# Patient Record
Sex: Male | Born: 1975 | Race: Black or African American | Hispanic: No | Marital: Married | Smoking: Former smoker
Health system: Southern US, Community
[De-identification: ages and names within clinical notes are randomized; demographics above are authoritative.]

## PROBLEM LIST (undated history)

## (undated) DIAGNOSIS — G4730 Sleep apnea, unspecified: Secondary | ICD-10-CM

## (undated) DIAGNOSIS — I10 Essential (primary) hypertension: Secondary | ICD-10-CM

## (undated) DIAGNOSIS — K219 Gastro-esophageal reflux disease without esophagitis: Secondary | ICD-10-CM

## (undated) HISTORY — DX: Sleep apnea, unspecified: G47.30

## (undated) HISTORY — PX: HERNIA REPAIR: SHX51

## (undated) HISTORY — DX: Gastro-esophageal reflux disease without esophagitis: K21.9

## (undated) HISTORY — DX: Essential (primary) hypertension: I10

## (undated) HISTORY — PX: LAPAROSCOPIC GASTRIC SLEEVE RESECTION: SHX5895

---

## 2013-10-23 ENCOUNTER — Ambulatory Visit: Payer: Self-pay | Admitting: Podiatry

## 2013-11-06 ENCOUNTER — Ambulatory Visit (INDEPENDENT_AMBULATORY_CARE_PROVIDER_SITE_OTHER): Payer: Commercial Managed Care - PPO | Admitting: Podiatry

## 2013-11-06 ENCOUNTER — Ambulatory Visit (INDEPENDENT_AMBULATORY_CARE_PROVIDER_SITE_OTHER): Payer: Commercial Managed Care - PPO

## 2013-11-06 ENCOUNTER — Encounter: Payer: Self-pay | Admitting: Podiatry

## 2013-11-06 VITALS — BP 140/96 | HR 88 | Resp 16 | Ht 70.0 in | Wt 286.0 lb

## 2013-11-06 DIAGNOSIS — L259 Unspecified contact dermatitis, unspecified cause: Secondary | ICD-10-CM

## 2013-11-06 DIAGNOSIS — R52 Pain, unspecified: Secondary | ICD-10-CM

## 2013-11-06 DIAGNOSIS — M7750 Other enthesopathy of unspecified foot: Secondary | ICD-10-CM

## 2013-11-06 DIAGNOSIS — B351 Tinea unguium: Secondary | ICD-10-CM

## 2013-11-06 MED ORDER — TERBINAFINE HCL 250 MG PO TABS: 250.0000 mg | ORAL_TABLET | Freq: Every day | ORAL | Status: DC

## 2013-11-06 NOTE — Progress Notes (Signed)
Subjective:     Patient ID: Jerry Esparza, male   DOB: Nov 01, 1976, 37 y.o.   MRN: [id number]  Foot Pain   patient states both of my feet are sore at the end of the day I have a lot of problems with my toenails and I have dry skin H. that he has had his nails removed and a regrew thick and discomfort   Review of Systems  All other systems reviewed and are negative.       Objective:   Physical Exam  Constitutional: He is oriented to person, place, and time. He appears well-developed.  Cardiovascular: Intact distal pulses.   Musculoskeletal: Normal range of motion.  Neurological: He is oriented to person, place, and time.  Skin: Skin is warm.   patient's muscle strength was adequate with mild equinus condition noted both feet I did noted depression of the arch bilateral with normal subtalar and mid tarsal joint range of motion. Moderate discomfort in the arch noted both feet. Nailbeds are very thickened and discomforting when pressed both feet. Dry skin noted both feet    Assessment:     Plantar fasciitis bilateral secondary to foot structure. 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 Night splint dispensed with instructions on usage for stretch of the arch and patient scanned for custom orthotic devices. Lamisil was prescribed with instructions on usage and patient will have liver function study done at the lab today. Begin Vaseline with saran wrap to try to help dry skin

## 2013-11-06 NOTE — Progress Notes (Signed)
N aching  L both feet arch to heel  D over 1 year  O after doing a 5k  C about the same A in the morning and late night  T ibuprofen , epsom salt soaks

## 2013-11-06 NOTE — Patient Instructions (Signed)
vaseline before bed twice a week and then wrap in saran wrap and white sock

## 2013-11-09 ENCOUNTER — Encounter: Payer: Self-pay | Admitting: Podiatry

## 2013-12-04 ENCOUNTER — Ambulatory Visit (INDEPENDENT_AMBULATORY_CARE_PROVIDER_SITE_OTHER): Payer: Commercial Managed Care - PPO | Admitting: Podiatry

## 2013-12-04 ENCOUNTER — Encounter: Payer: Self-pay | Admitting: Podiatry

## 2013-12-04 VITALS — BP 152/74 | HR 85 | Resp 16 | Ht 70.0 in | Wt 280.0 lb

## 2013-12-04 DIAGNOSIS — M7750 Other enthesopathy of unspecified foot: Secondary | ICD-10-CM

## 2013-12-04 NOTE — Patient Instructions (Signed)

## 2013-12-05 NOTE — Progress Notes (Signed)
Subjective:     Patient ID: Jerry Esparza, male   DOB: 08/17/1976, 37 y.o.   MRN: [id number]  HPI feet feeling better and orthotics fitted well   Review of Systems     Objective:   Physical Exam Neurovascular status intact with chronic tendinitis that improving    Assessment:     Tendinitis that is getting better    Plan:     Dispensed orthotics with instructions of usage physical therapy and shoe gear usage reappoint her recheck 6 weeks

## 2014-01-15 ENCOUNTER — Ambulatory Visit: Payer: Commercial Managed Care - PPO | Admitting: Podiatry

## 2014-12-04 ENCOUNTER — Inpatient Hospital Stay: Payer: Self-pay | Admitting: Internal Medicine

## 2014-12-04 LAB — COMPREHENSIVE METABOLIC PANEL
ALBUMIN: 3.5 g/dL (ref 3.4–5.0)
ALT: 36 U/L
ANION GAP: 8 (ref 7–16)
AST: 25 U/L (ref 15–37)
Alkaline Phosphatase: 80 U/L
BUN: 9 mg/dL (ref 7–18)
Bilirubin,Total: 0.9 mg/dL (ref 0.2–1.0)
CHLORIDE: 104 mmol/L (ref 98–107)
CREATININE: 1.1 mg/dL (ref 0.60–1.30)
Calcium, Total: 8.8 mg/dL (ref 8.5–10.1)
Co2: 28 mmol/L (ref 21–32)
EGFR (African American): >60
EGFR (Non-African Amer.): >60
GLUCOSE: 81 mg/dL (ref 65–99)
Osmolality: 277 (ref 275–301)
POTASSIUM: 3.9 mmol/L (ref 3.5–5.1)
SODIUM: 140 mmol/L (ref 136–145)
TOTAL PROTEIN: 8.3 g/dL — AB (ref 6.4–8.2)

## 2014-12-04 LAB — CBC
HCT: 48 % (ref 40.0–52.0)
HGB: 15.1 g/dL (ref 13.0–18.0)
MCH: 29.3 pg (ref 26.0–34.0)
MCHC: 31.5 g/dL — ABNORMAL LOW (ref 32.0–36.0)
MCV: 93 fL (ref 80–100)
PLATELETS: 375 10*3/uL (ref 150–440)
RBC: 5.17 10*6/uL (ref 4.40–5.90)
RDW: 14.1 % (ref 11.5–14.5)
WBC: 16 10*3/uL — AB (ref 3.8–10.6)

## 2014-12-04 LAB — URIC ACID: Uric Acid: 4.9 mg/dL (ref 3.5–7.2)

## 2014-12-05 LAB — COMPREHENSIVE METABOLIC PANEL
ALT: 29 U/L
AST: 14 U/L — AB (ref 15–37)
Albumin: 3 g/dL — ABNORMAL LOW (ref 3.4–5.0)
Alkaline Phosphatase: 76 U/L
Anion Gap: 8 (ref 7–16)
BILIRUBIN TOTAL: 0.5 mg/dL (ref 0.2–1.0)
BUN: 16 mg/dL (ref 7–18)
CHLORIDE: 110 mmol/L — AB (ref 98–107)
CREATININE: 1.05 mg/dL (ref 0.60–1.30)
Calcium, Total: 8.9 mg/dL (ref 8.5–10.1)
Co2: 23 mmol/L (ref 21–32)
EGFR (African American): >60
EGFR (Non-African Amer.): >60
Glucose: 111 mg/dL — ABNORMAL HIGH (ref 65–99)
OSMOLALITY: 283 (ref 275–301)
Potassium: 4 mmol/L (ref 3.5–5.1)
Sodium: 141 mmol/L (ref 136–145)
TOTAL PROTEIN: 8 g/dL (ref 6.4–8.2)

## 2014-12-05 LAB — URINALYSIS, COMPLETE
BLOOD: NEGATIVE
Bilirubin,UR: NEGATIVE
Glucose,UR: NEGATIVE mg/dL (ref 0–75)
KETONE: NEGATIVE
Leukocyte Esterase: NEGATIVE
Nitrite: NEGATIVE
PH: 5 (ref 4.5–8.0)
Protein: 30
RBC,UR: 3 /HPF (ref 0–5)
Specific Gravity: 1.038 (ref 1.003–1.030)
WBC UR: 3 /HPF (ref 0–5)

## 2014-12-05 LAB — CBC WITH DIFFERENTIAL/PLATELET
BASOS PCT: 0.1 %
Basophil #: 0 10*3/uL (ref 0.0–0.1)
Eosinophil #: 0 10*3/uL (ref 0.0–0.7)
Eosinophil %: 0 %
HCT: 45.6 % (ref 40.0–52.0)
HGB: 14.6 g/dL (ref 13.0–18.0)
Lymphocyte #: 0.8 10*3/uL — ABNORMAL LOW (ref 1.0–3.6)
Lymphocyte %: 4.4 %
MCH: 30 pg (ref 26.0–34.0)
MCHC: 32.1 g/dL (ref 32.0–36.0)
MCV: 93 fL (ref 80–100)
Monocyte #: 0.7 x10 3/mm (ref 0.2–1.0)
Monocyte %: 3.9 %
Neutrophil #: 16.5 10*3/uL — ABNORMAL HIGH (ref 1.4–6.5)
Neutrophil %: 91.6 %
Platelet: 377 10*3/uL (ref 150–440)
RBC: 4.89 10*6/uL (ref 4.40–5.90)
RDW: 14.4 % (ref 11.5–14.5)
WBC: 18 10*3/uL — AB (ref 3.8–10.6)

## 2015-03-14 DIAGNOSIS — N529 Male erectile dysfunction, unspecified: Secondary | ICD-10-CM | POA: Insufficient documentation

## 2015-03-14 DIAGNOSIS — K219 Gastro-esophageal reflux disease without esophagitis: Secondary | ICD-10-CM | POA: Insufficient documentation

## 2015-03-14 DIAGNOSIS — I10 Essential (primary) hypertension: Secondary | ICD-10-CM | POA: Insufficient documentation

## 2015-03-16 DIAGNOSIS — Z6841 Body Mass Index (BMI) 40.0 and over, adult: Secondary | ICD-10-CM | POA: Insufficient documentation

## 2015-04-17 DIAGNOSIS — Z9989 Dependence on other enabling machines and devices: Secondary | ICD-10-CM

## 2015-04-17 DIAGNOSIS — G4733 Obstructive sleep apnea (adult) (pediatric): Secondary | ICD-10-CM | POA: Insufficient documentation

## 2015-04-19 NOTE — Op Note (Signed)
PATIENT NAME:  Novella RobDOVE, Amritpal MR#:  [id number] DATE OF BIRTH:  03-03-1976  DATE OF PROCEDURE:  12/04/2014  PREOPERATIVE DIAGNOSIS:   Right tonsillitis with possible peritonsillar abscess.   POSTOPERATIVE DIAGNOSIS: Right tonsillitis with possible peritonsillar abscess.   PROCEDURE: Aspiration of right peritonsillar region.   SURGEON: Marion DownerScott Dresden Lozito, M.D.   ANESTHESIA: 1% lidocaine with epinephrine 1:200,000.   DESCRIPTION OF PROCEDURE: After discussing the procedure with the patient, the region around the right tonsil was injected with 1% lidocaine with epinephrine 1:200,000. An 18-gauge needle was then used to make several passes along the superior pole of the tonsil and just lateral to the tonsil. No purulence was encountered during attempts at aspiration, just some bloody material. This most likely represents peritonsillar cellulitis with no frank abscess. The patient tolerated the procedure well with minimal bleeding.     ____________________________ Ollen GrossPaul S. Willeen CassBennett, MD psb:kl D: 12/04/2014 12:32:51 ET T: 12/04/2014 16:53:35 ET JOB#: [id number]  cc: Ollen GrossPaul S. Willeen CassBennett, MD, <Dictator> Sandi MealyPAUL S Lailoni Baquera MD ELECTRONICALLY SIGNED 12/15/2014 13:57

## 2015-04-19 NOTE — Consult Note (Signed)
PATIENT NAME:  Jerry Esparza, Jerry Esparza MR#:  [id number] DATE OF BIRTH:  March 03, 1976  DATE OF CONSULTATION:  12/04/2014  REFERRING PHYSICIAN:  Dr. Mayford KnifeWilliams CONSULTING PHYSICIAN:  Ollen GrossPaul S. Willeen CassBennett, MD  REASON FOR CONSULTATION: Tonsillitis with possible abscess.   HISTORY OF PRESENT ILLNESS: A 39 year old male presents with a 3-day history of sore throat and difficulty swallowing, worsening today. He did start some antibiotics yesterday. There is no history of recurrent tonsillitis. He actually recalls having some type of surgery as a child, possibly an adenoidectomy. Initially there was a thought he could have angioedema; however, he did have an elevated white count and CT scan suggested severe right-sided tonsillitis with some hypodensity around the tonsil. They could not completely exclude the possibility of an abscess so I was consulted to evaluate further.   PAST MEDICAL HISTORY: Hypertension No history of diabetes or asthma.   MEDICATION: Lisinopril 20 mg p.o. daily.   ALLERGIES: None   SOCIAL HISTORY: The patient is a nonsmoker.   REVIEW OF SYSTEMS: The patient has had a sore throat and some low-grade fever with muffling of the voice. He is not having any significant breathing difficulty, although his wife reports that occasionally he feels short of breath intermittently. The preceded this actual illness. No chest pain, rash, nausea, vomiting, diarrhea.   PHYSICAL EXAMINATION: VITAL SIGNS: Temperature 98.1, pulse 99, oxygen concentration 99% on room air.  GENERAL: Well-developed, well-nourished male, obviously uncomfortable, but not in any respiratory distress.  HEAD AND FACE: Head is normocephalic, atraumatic. No facial skin lesions. Facial strength is normal and symmetric.  EARS: External ears are unremarkable. Ear canals are free of cerumen. Tympanic membranes are clear bilaterally. NOSE: External nose unremarkable. Nasal cavity is clear. No purulence or polyps are seen. Septum is straight.  ORAL  CAVITY AND OROPHARYNX: Teeth, lips and gums unremarkable. Tongue and floor of mouth without lesions. Posterior pharynx reveals edema of the right palate and tonsil with scant exudate. The left tonsil was normal in size without exudate. The uvula is not significantly deviated, but there is edema of the uvula.  NECK: Supple with tender jugulodigastric adenopathy on the right without fluctuance. There is no thyromegaly. Salivary glands are soft and without masses.   DATA REVIEW: The patient's white count is elevated at 16.1. His CT scan was reviewed personally and agree he has significant tonsillar swelling on the right side with cervical lymphadenopathy and some hypodensity adjacent to the tonsil and laterally. There was no frank abscess on CT.   ASSESSMENT: A patient with severe tonsillitis. I did make an attempt at aspiration around the peritonsillar space and no fluid was obtained. This appears to be tonsillitis with a significant surrounding cellulitis and lymphadenitis, but no abscess.   PLAN: Recommend admission for hydration, IV antibiotics, steroids and overnight observation. If his symptoms are improving to where he can swallow adequately tomorrow,  he likely could be discharged on oral antibiotics and steroids at that time.   ____________________________ Ollen GrossPaul S. Willeen CassBennett, MD psb:dw D: 12/04/2014 12:31:28 ET T: 12/04/2014 13:42:31 ET JOB#: [id number]  cc: Ollen GrossPaul S. Willeen CassBennett, MD, <Dictator> Sandi MealyPAUL S Montray Kliebert MD ELECTRONICALLY SIGNED 12/15/2014 13:57

## 2015-04-19 NOTE — Discharge Summary (Signed)
PATIENT NAME:  Jerry Esparza, Steven MR#:  [id number] DATE OF BIRTH:  1976/10/14  DATE OF ADMISSION:  12/04/2014 DATE OF DISCHARGE:  12/05/2014  ADMISSION DIAGNOSIS: Acute tonsillitis.   DISCHARGE DIAGNOSES:   1.  Acute right-sided tonsillitis.  2.  History of essential hypertension.   CONSULTATIONS: Ollen Grossaul S. Willeen CassBennett, MD    LABORATORIES AT DISCHARGE:  White blood cells 18, hemoglobin 14.6, hematocrit 45.6, platelets of 377. Sodium 141, potassium 4.0, chloride 110, bicarbonate 23, BUN 16, creatinine 1.05. Glucose is 111.   DISCHARGE PHYSICAL EXAMINATION:  VITAL SIGNS: The patient is afebrile. Temperature 97.5, pulse is 81, respirations 20, blood pressure 138/78, 95% on room air.  GENERAL: The patient is alert, oriented, not in acute distress.  HEENT: Head is atraumatic. Pupils are round and reactive. As per ENT, he has decreased palate and tonsil edema from yesterday.  CARDIOVASCULAR: Regular rate and rhythm. No murmur, gallops or rubs. PMI is not displaced.  LUNGS: Clear to auscultation without crackles, rales, rhonchi or wheezing.   ABDOMEN: Obese. Bowel sounds are positive, nontender, hard to appreciate organomegaly due to body habitus.  EXTREMITIES: No clubbing, cyanosis, or edema.   HOSPITAL COURSE: A very pleasant 39 year old male who presented with intense sore throat, had a CT scan, which showed acute tonsillitis. Subsequently underwent possible aspiration of right peritonsillar region. No evidence of abscess but was admitted for treatment for his tonsillitis. For further details, please refer to the H and P.  1.  Acute tonsillitis infectious in nature. Appreciate Dr. Willeen CassBennett ENT's consult. The patient had no evidence of an abscess. He was placed on Unasyn and Decadron. He actually responded quite well to this. As per ENT, his swelling has much decreased. He had no evidence of abscess as mentioned before. He is able to tolerate a liquid diet and is speaking without any respiratory issues. The  patient will be discharged with p.o. antibiotics and steroids with followup in 10 days with Dr. Willeen CassBennett.  2.  Dysphasia, which has improved. The patient was on steroid.  3.  Essential hypertension. The patient may resume lisinopril as this was not thought to be angioedema.  4.  Leukocytosis steroid induced and also infection induced.   DISCHARGE MEDICATIONS:  1.  Lisinopril 20 mg daily.  2.  Augmentin 875 one tablet p.o. b.i.d. x 10 days.  3.  Dexamethasone 2 mg b.i.d. for 4 days.   DISCHARGE DIET: Will try liquids and soft, then advance to regular as tolerated.    DISCHARGE ACTIVITY: As tolerated.   DISCHARGE FOLLOWUP: The patient will follow up with Dr. Willeen CassBennett in 10 days.    TIME SPENT:  45 minutes.  The patient was stable for discharge.     ____________________________ Annessa Satre P. Juliene PinaMody, MD spm:AT D: 12/05/2014 12:58:49 ET T: 12/06/2014 00:03:19 ET JOB#: [id number]  cc: Kae Lauman P. Juliene PinaMody, MD, <Dictator> Janyth ContesSITAL P Tomica Arseneault MD ELECTRONICALLY SIGNED 12/06/2014 13:51

## 2015-04-19 NOTE — Consult Note (Signed)
Patient seen and attempt made to drain area around tonsil, but no purulence isolated. Recommend IV Unasyn or Cleocin and Decadron. This is likely a phlegmon/cellulitis without frank abscess. If patient showing clinical improvement tomorrow, may be discharged on oral antibiotics.   Electronic Signatures: Sandi MealyBennett, Quentyn Kolbeck S (MD)  (Signed on 09-Dec-15 12:34)  Authored  Last Updated: 09-Dec-15 12:34 by Sandi MealyBennett, Lorne Winkels S (MD)

## 2015-04-23 NOTE — H&P (Signed)
PATIENT NAME:  Jerry Esparza, VACHA MR#:  [id number] DATE OF BIRTH:  [birth year]  DATE OF ADMISSION:  12/04/2014  PRIMARY CARE PHYSICIAN: [facility].   HISTORY OF PRESENT ILLNESS: The patient is a 39 year old African American male with history of hypertension, who presents to the hospital with complaints of severe swelling in his throat. The patient was doing well up until approximately 3 days ago when he started having some pain in his throat, especially on the right side of the throat, and then his throat significantly swelled up. He felt that it could have been his tonsils and he decided to come to the emergency room for further evaluation. In the emergency room, he had CT scan of his neck done, which revealed possible phlegmonous collection of pus within the area. Dr. Willeen Cass was consulted who tried to aspirate from that area, however, no purulence was isolated, Unasyn IV as well as Decadron was recommended for this patient and it was felt that patient very likely has phlegmon cellulitis without frank abscess. Hospitalist services were contacted for admission. The patient denies any fevers, admits to having sweating. He  tells me that he is not able to swallow his own saliva over the past day. His last meal was yesterday at around 6:30 p.m. He admits to having some weakness and some weight loss over the past 1 week. He admits to having some cough with some yellow, clear, intermittently bloody sputum production. Admits to some nausea.   PAST MEDICAL HISTORY:  Significant for history of hypertension.  MEDICATIONS:  Lisinopril 10 mg p.o. daily.  SURGERIES:  Two umbilical hernia surgeries in 2000 and 2005.   ALLERGIES: None.   FAMILY HISTORY: The patient's father had diabetes, hyperlipidemia, coronary artery disease as well as hypertension. The patient's mother died at a young age. She had breast carcinoma at the age of 72.   SOCIAL HISTORY: The patient is married and they have no children. No smoking,  intermittent alcohol use. He drinks probably a few glasses of wine a month. He drives truck.   REVIEW OF SYSTEMS:  CONSTITUTIONAL:  Positive for weakness, weight loss, some right facial achiness, inability to swallow and headaches over the past 3 or 4 days. Cough with yellowish phlegm production and intermittent bloody phlegm production, some nausea. Inability to swallow saliva as mentioned above,  weight loss of approximately 5 pounds in 1 week.  MUSCULOSKELETAL:  Admits to having some right ankle achiness and painfulness. He had trauma apparently a month ago and now is having some discomfort in his right ankle.  HEENT:  Denies any high fevers or chills. Admits to significant sweating, inability to sleep even at night because of saliva pooling in the mouth. No blurry vision, double vision, glaucoma, or cataracts. He denies any tinnitus, allergies, epistaxis, sinusitis, and has difficulty swallowing.  RESPIRATORY:  Denies any wheezes, asthma, COPD.  CARDIOVASCULAR: Denies chest pains, orthopnea, arrhythmias, palpitations or syncope. GASTROINTESTINAL: Denies nausea, vomiting, diarrhea, constipation, rectal bleeding, change in bowel habits.  GENITOURINARY: Denies dysuria, hematuria, frequency, incontinence.  ENDOCRINOLOGY: Denies any polydipsia, sensitivity, thyroid problems, heat or cold intolerance or thirst.  HEMATOLOGIC: Denies anemia.  SKIN: Denies arthritis, cramps, or tremor.  PSYCHIATRIC: Denies anxiety, insomnia, or depression.   PHYSICAL EXAMINATION: VITAL SIGNS:  On arrival to the hospital, the patient's temperature was 98.1, pulse 99, respiration was 18, blood pressure 141/90, saturation was 99% on room air.  GENERAL: This is a well-developed, well-nourished, obese African American male in moderate distress lying on the stretcher.  HEENT:  Pupils are equal, and light. Extraocular muscles intact, no icterus or conjunctivitis. Has normal hearing. The patient does have evidence of edema and  significant swelling. Tongue is obscuring, unfortunately, his tonsils and I am not able to compress them down because of discomfort and gagging.  Posterior pharynx, especially on the right side, is inflamed. Submandibular swelling and swelling of intraoral cavity was also noted with painful palpation, especially on the right side and less so on the left.  NECK:  No JVD or carotid bruits bilaterally. Full range of motion.  LUNGS: Clear to auscultation in all fields. No dullness to percussion, increased effort,  or overt respiratory distress.  CARDIOVASCULAR: S1, S2 appreciated. Rhythm is regular.  CHEST:  Nontender to palpation. 1+ pedal pulses.  EXTREMITIES: No lower extremity edema, calf tenderness or cyanosis was noted.  ABDOMEN: Soft, nontender. Bowel sounds are present. No hepatosplenomegaly or masses were noted.  RECTAL: Deferred.  MUSCLE STRENGTH: Able to move all extremities. No cyanosis, degenerative joint disease or kyphosis is noted.  SKIN: Did not reveal any rashes, lesions, erythema, nodularity or induration. It was warm and dry to palpation.  LYMPHATIC: There is adenopathy in the cervical region.  NEUROLOGIC: Cranial nerves grossly intact. Sensory is intact. The patient does have significant dysarthria. He is alert, cooperative. Memory is good.  PSYCHIATRIC: No significant confusion, agitation, or depression.   LABORATORY DATA: BMP was normal limits. Liver enzymes elevated, total protein level of 8.3, otherwise normal. White blood cell count is elevated to 16,000, hemoglobin 15.1, platelet count 375,000.   RADIOLOGIC STUDIES: CT scan of the neck with contrast on 12/04/2014, revealed severe right peritonsillar swelling or soft tissue prominence noted with associated enlargement of right submandibular gland, with surrounding fluid. These findings are most consistent with infection or inflammation. Ill-defined low density is seen in the area suggesting the possibility of underlying abscess.  Mild right cervical adenopathy was noted which most likely is inflammatory in origin. There also appears to be debris within the posterior of hypopharynx consistent with inflammatory debris or ingested material.    Dr. Talmage NapBennett's consult note today on 12/04/2014, patient seen and attempt made to drain the area around the tonsils, but no purulence aspirated. Recommend IV Unasyn and Cleocin and Decadron, likely phlegmon cellulitis without frank abscess. If patient is showing clinical improvement tomorrow he may be discharged on oral antibiotic according to Dr. Willeen CassBennett.   ASSESSMENT AND PLAN:  1. Acute infectious tonsillitis. Admit patient to the medical floor. Get swab for cultures. Start the patient on Unasyn and Decadron IV.  Keep him n.p.o. We will continue IV fluids for now. 2. Dysphagia due to tonsillitis, n.p.o. The patient may be started on liquids if he is able to swallow his saliva tomorrow.  3. Hypertension. Will watch his blood pressure readings. Will start Vasotec IV if needed. No p.o. medications since severe swallowing difficulties.  4. Leukocytosis. We will follow with antibiotic therapy.  5. Right ankle pain. We will get uric acid level checked and we will get an x-ray of his right ankle done.   TIME SPENT: 50 minutes.     ____________________________ Katharina Caperima Amour Cutrone, MD rv:kl D: 12/04/2014 13:12:09 ET T: 12/04/2014 13:52:12 ET JOB#: [id number]  cc: Katharina Caperima Rashaun Curl, MD, <Dictator> Demontray Franta MD ELECTRONICALLY SIGNED 01/07/2015 18:14

## 2015-09-19 IMAGING — CT CT NECK WITH CONTRAST
4 of 5 series · 14 of 33 positions shown, 16 images · IV contrast (isovue)
Comparison: None.

CLINICAL DATA: Throat swelling.

EXAM:
CT NECK WITH CONTRAST
TECHNIQUE: Multidetector CT imaging of the neck was performed using the
standard protocol following the bolus administration of intravenous
contrast.
CONTRAST:  75 mL of Isovue 370 intravenously.

[Series 2: axial neck · axial · 0.66mm/px · z∈[+974,+1024]mm · 2 of 127 slices shown]
[im 26/127  bone]
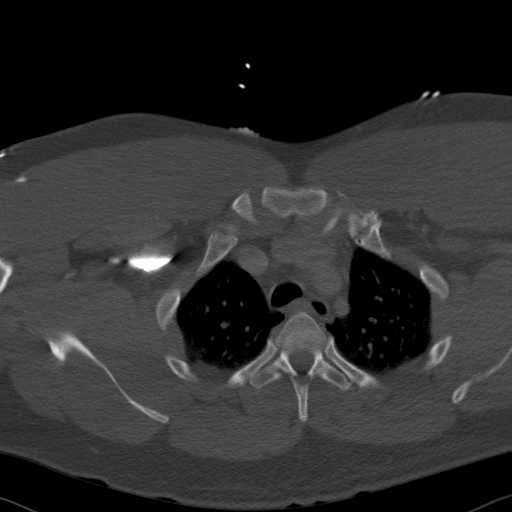
[im 51/127  bone]
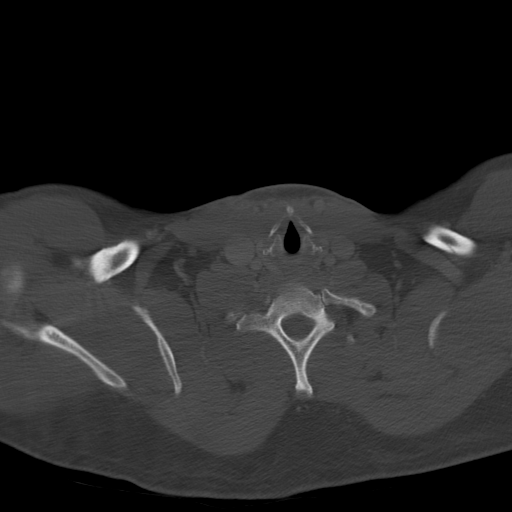

[Series 5: sag neck · sagittal · 0.52mm/px · 5 of 103 slices shown, 6 images]
[im 35/103  bone]
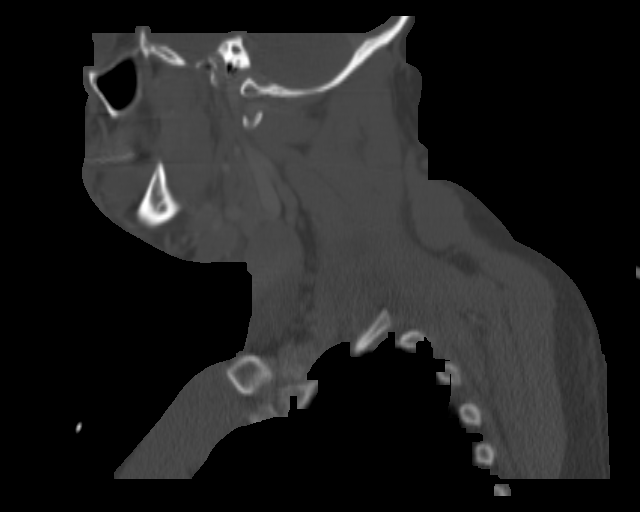
[im 43/103  bone]
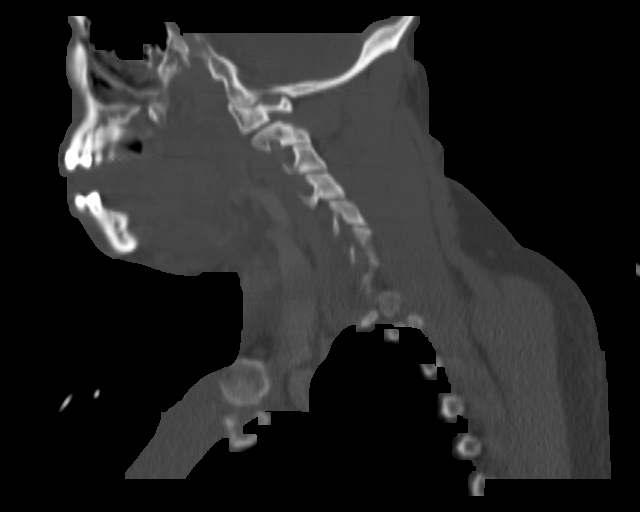
[im 52/103  soft-tissue]
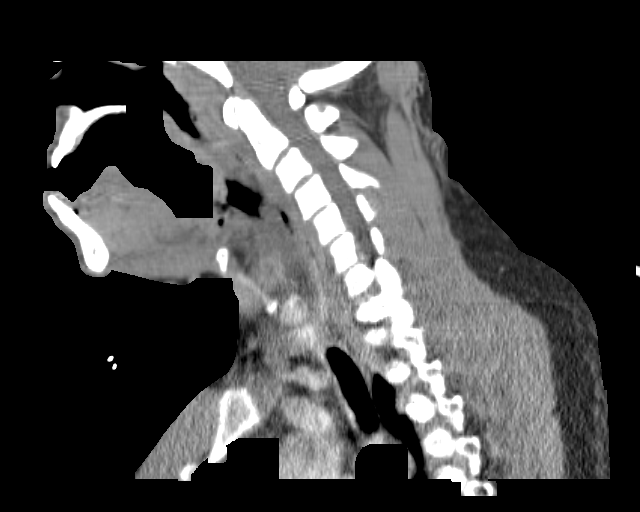
[im 52/103  bone]
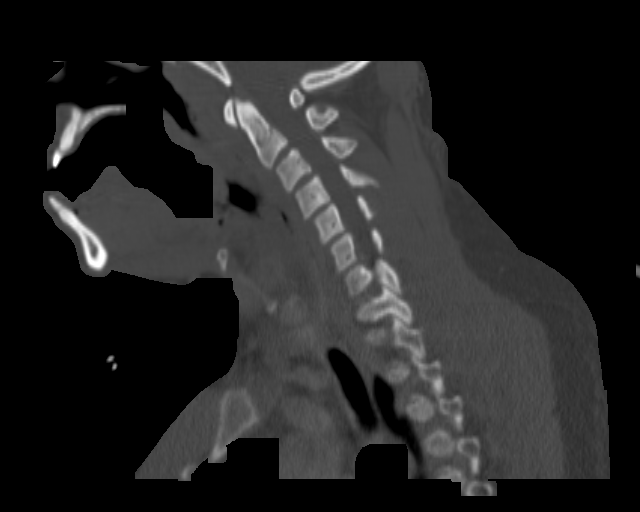
[im 60/103  bone]
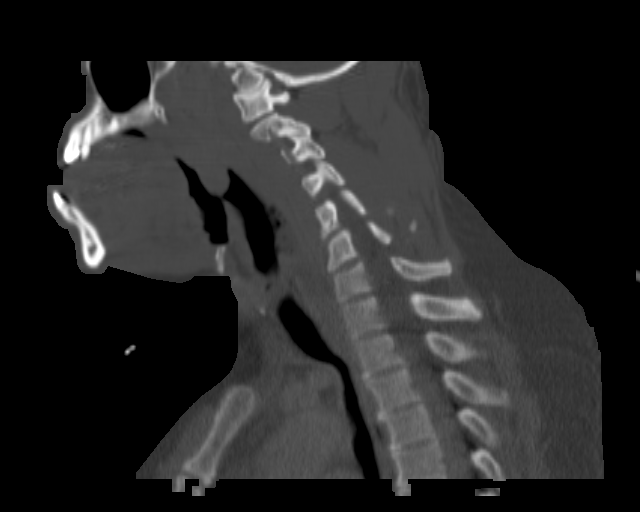
[im 69/103  bone]
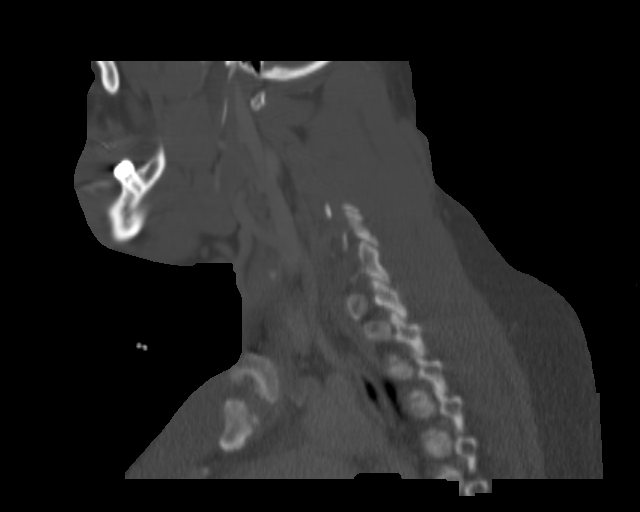

[Series 6: cor neck · coronal · 0.52mm/px · 3 of 144 slices shown]
[im 29/144  bone]
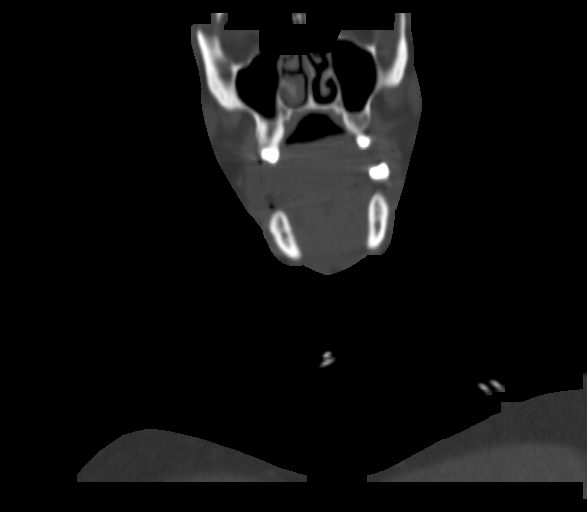
[im 58/144  bone]
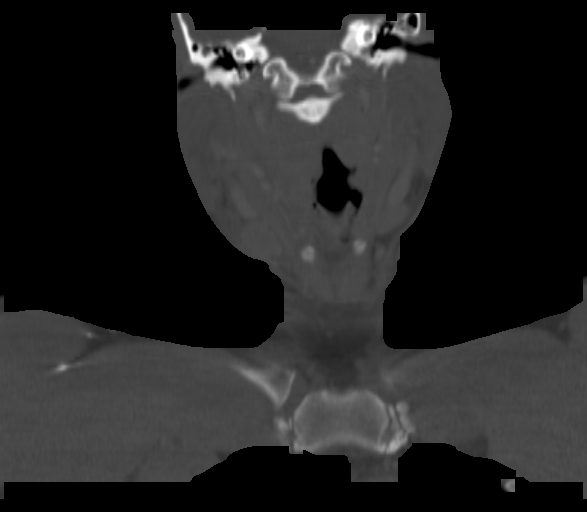
[im 86/144  bone]
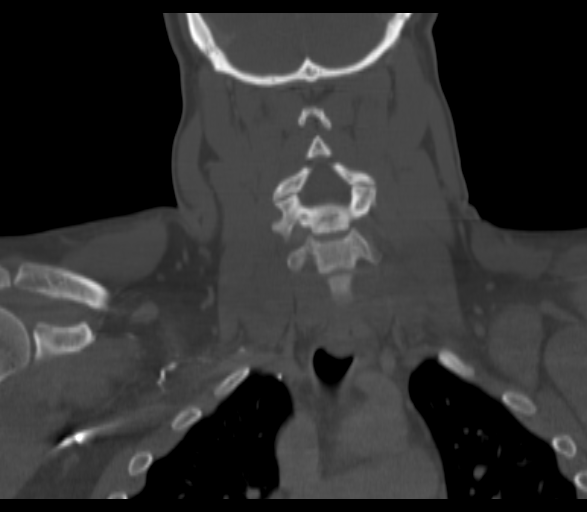

[Series 7: ax oropharynx · axial · 0.53mm/px · z∈[+929,+1098]mm · 4 of 149 slices shown, 5 images]
[im 30/149  soft-tissue]
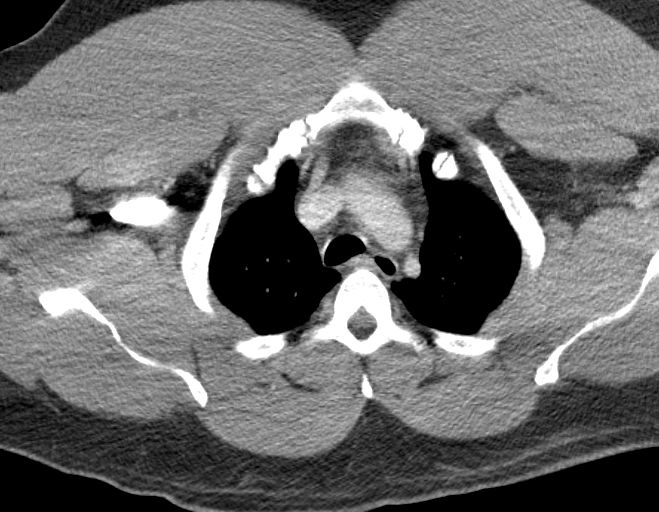
[im 30/149  bone]
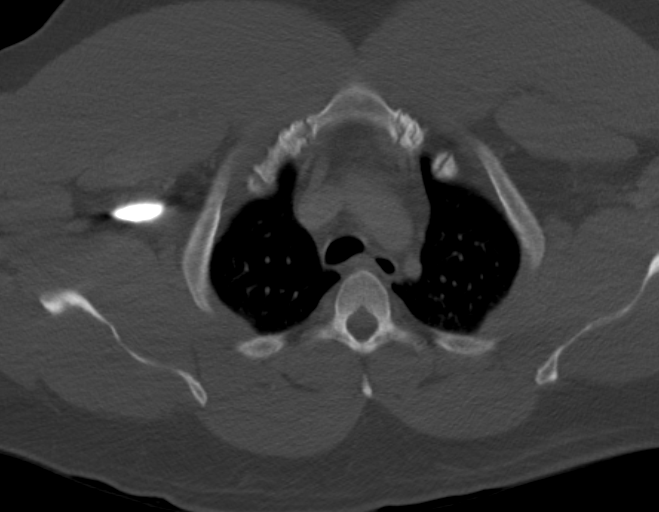
[im 60/149  bone]
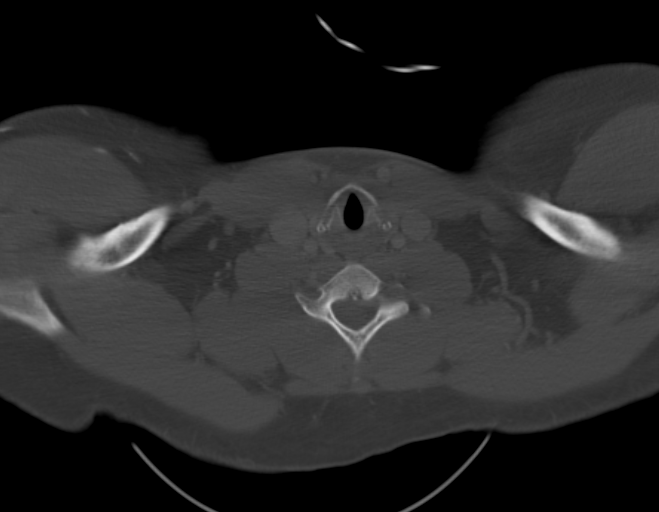
[im 89/149  bone]
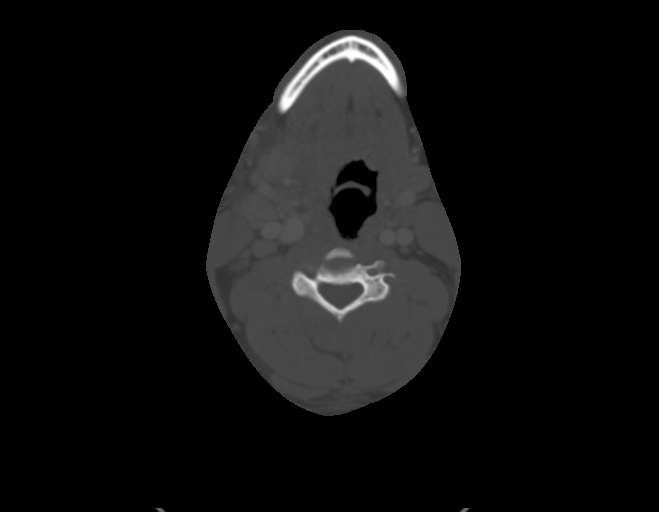
[im 119/149  bone]
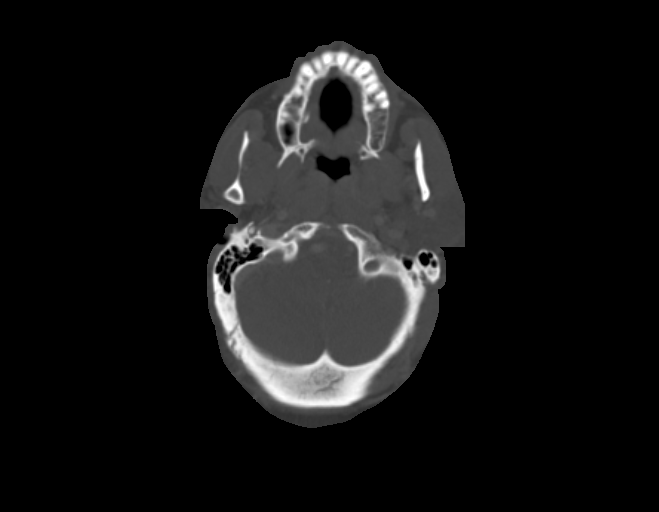

[14 of 33 positions shown; findings below may reference images not displayed]

FINDINGS: Visualized upper lung fields appear normal. Paranasal sinuses appear
normal. Parotid glands appear normal. Blanks in thyroid gland appear
normal epiglottis appears normal. Severe swelling of the right
peritonsillar region is noted with ill-defined low density which
potentially may represent underlying abscess. There is also noted a
significant amount of debris or other soft tissue material
posteriorly within the hypopharynx at the level of the epiglottis.
Visualized vasculature appears normal. Left submandibular gland
appears normal. Significant enlargement of right submandibular gland
is noted with surrounding inflammation, which is contiguous with
peritonsillar swelling previously described. Mildly enlarged
adenopathy is noted in the right level 2 region.
IMPRESSION: Severe right peritonsillar swelling or soft tissue prominence is
noted, with associated enlargement of right submandibular gland with
surrounding fluid. These findings are most consistent with infection
or inflammation. Ill-defined low density is seen within this area
suggesting the possibility of underlying abscess. Mild right
cervical adenopathy is noted which most likely is inflammatory in
origin. There also appears to be debris within the posterior portion
of the hypopharynx consistent with inflammatory debris or ingested
material.

## 2016-02-16 DIAGNOSIS — Z6841 Body Mass Index (BMI) 40.0 and over, adult: Secondary | ICD-10-CM | POA: Insufficient documentation

## 2016-02-16 DIAGNOSIS — R635 Abnormal weight gain: Secondary | ICD-10-CM | POA: Insufficient documentation

## 2016-02-16 DIAGNOSIS — I10 Essential (primary) hypertension: Secondary | ICD-10-CM | POA: Insufficient documentation

## 2016-04-12 ENCOUNTER — Other Ambulatory Visit: Payer: Self-pay | Admitting: Specialist

## 2016-04-12 DIAGNOSIS — G4733 Obstructive sleep apnea (adult) (pediatric): Secondary | ICD-10-CM

## 2016-05-07 ENCOUNTER — Ambulatory Visit
Admission: RE | Admit: 2016-05-07 | Discharge: 2016-05-07 | Disposition: A | Discharge status: Home / Self Care | Payer: BLUE CROSS/BLUE SHIELD | Source: Ambulatory Visit | Attending: Specialist | Admitting: Specialist

## 2016-05-07 DIAGNOSIS — G4733 Obstructive sleep apnea (adult) (pediatric): Secondary | ICD-10-CM | POA: Diagnosis not present

## 2016-05-07 DIAGNOSIS — I10 Essential (primary) hypertension: Secondary | ICD-10-CM | POA: Diagnosis not present

## 2016-09-24 ENCOUNTER — Encounter: Payer: Self-pay | Admitting: Urology

## 2016-09-24 ENCOUNTER — Ambulatory Visit (INDEPENDENT_AMBULATORY_CARE_PROVIDER_SITE_OTHER): Payer: BLUE CROSS/BLUE SHIELD | Admitting: Urology

## 2016-09-24 VITALS — BP 126/85 | HR 76 | Ht 70.0 in | Wt 263.3 lb

## 2016-09-24 DIAGNOSIS — R300 Dysuria: Secondary | ICD-10-CM

## 2016-09-24 DIAGNOSIS — I10 Essential (primary) hypertension: Secondary | ICD-10-CM | POA: Insufficient documentation

## 2016-09-24 DIAGNOSIS — E669 Obesity, unspecified: Secondary | ICD-10-CM | POA: Insufficient documentation

## 2016-09-24 LAB — URINALYSIS, COMPLETE
BILIRUBIN UA: NEGATIVE
Glucose, UA: NEGATIVE
Ketones, UA: NEGATIVE
Leukocytes, UA: NEGATIVE
Nitrite, UA: NEGATIVE
PH UA: 7.5 (ref 5.0–7.5)
Protein, UA: NEGATIVE
RBC UA: NEGATIVE
Specific Gravity, UA: 1.02 (ref 1.005–1.030)
UUROB: 0.2 mg/dL (ref 0.2–1.0)

## 2016-09-24 LAB — MICROSCOPIC EXAMINATION: Bacteria, UA: NONE SEEN

## 2016-09-24 LAB — BLADDER SCAN AMB NON-IMAGING: SCAN RESULT: 30

## 2016-09-24 NOTE — Progress Notes (Signed)
09/24/2016 11:08 AM   Jerry Esparza 04/07/1976 [id number]  Referring provider: Leotis ShamesJasmine Singh, MD [address] [facility] [place], [place] [id number]  Chief Complaint  Patient presents with  . New Patient (Initial Visit)  . Hematuria  . Dysuria    HPI: The patient is a 40 -year-old gentleman presents for evaluation For recurrent urinary tract infection. He was diagnosed in the last month with a urinary tract infection. He had another urinary tract infection over one year ago. At baseline he has no urologic complaints and voids well. He has no history of nephrolithiasis or GU surgery. When he gets a urinary tract infection, he has suprapubic pain and dysuria. He does not have these symptoms outside of these 2 times he had a UTI.   PMH: Past Medical History:  Diagnosis Date  . Acid reflux   . Hypertension   . Sleep apnea     Surgical History: Past Surgical History:  Procedure Laterality Date  . HERNIA REPAIR    . LAPAROSCOPIC GASTRIC SLEEVE RESECTION      Home Medications:    Medication List       Accurate as of 09/24/16 11:08 AM. Always use your most recent med list.          lisinopril 10 MG tablet Commonly known as:  PRINIVIL,ZESTRIL Take 10 mg by mouth daily.       Allergies: No Known Allergies  Family History: Family History  Problem Relation Age of Onset  . Kidney cancer Neg Hx   . Kidney disease Neg Hx   . Prostate cancer Neg Hx     Social History:  reports that he has quit smoking. His smoking use included Cigarettes. He has never used smokeless tobacco. He reports that he drinks alcohol. He reports that he does not use drugs.  ROS: UROLOGY Frequent Urination?: Yes Hard to postpone urination?: No Burning/pain with urination?: Yes Get up at night to urinate?: Yes Leakage of urine?: No Urine stream starts and stops?: No Trouble starting stream?: No Do you have to strain to urinate?: No Blood in urine?: Yes Urinary tract  infection?: Yes Sexually transmitted disease?: No Injury to kidneys or bladder?: No Painful intercourse?: No Weak stream?: No Erection problems?: No Penile pain?: No  Gastrointestinal Nausea?: No Vomiting?: No Indigestion/heartburn?: No Diarrhea?: No Constipation?: Yes  Constitutional Fever: Yes Night sweats?: Yes Weight loss?: No Fatigue?: No  Skin Skin rash/lesions?: No Itching?: No  Eyes Blurred vision?: No Double vision?: No  Ears/Nose/Throat Sore throat?: No Sinus problems?: No  Hematologic/Lymphatic Swollen glands?: No Easy bruising?: No  Cardiovascular Leg swelling?: No Chest pain?: No  Respiratory Cough?: No Shortness of breath?: No  Endocrine Excessive thirst?: No  Musculoskeletal Back pain?: No Joint pain?: No  Neurological Headaches?: No Dizziness?: No  Psychologic Depression?: No Anxiety?: No  Physical Exam: BP 126/85   Pulse 76   Ht 5\' 10"  (1.778 m)   Wt 263 lb 4.8 oz (119.4 kg)   BMI 37.78 kg/m   Constitutional:  Alert and oriented, No acute distress. HEENT: [place] AT, moist mucus membranes.  Trachea midline, no masses. Cardiovascular: No clubbing, cyanosis, or edema. Respiratory: Normal respiratory effort, no increased work of breathing. GI: Abdomen is soft, nontender, nondistended, no abdominal masses GU: No CVA tenderness. Normal phallus. Testicles descended equally bilaterally. DRE: 10 g prostate benign no signs of prostatitis. Skin: No rashes, bruises or suspicious lesions. Lymph: No cervical or inguinal adenopathy. Neurologic: Grossly intact, no focal deficits, moving all  4 extremities. Psychiatric: Normal mood and affect.  Laboratory Data: Lab Results  Component Value Date   WBC 18.0 (H) 12/05/2014   HGB 14.6 12/05/2014   HCT 45.6 12/05/2014   MCV 93 12/05/2014   PLT 377 12/05/2014    Lab Results  Component Value Date   CREATININE 1.05 12/05/2014    No results found for: PSA  No results found for:  TESTOSTERONE  No results found for: HGBA1C  Urinalysis    Component Value Date/Time   COLORURINE Yellow 12/05/2014 0714   APPEARANCEUR Cloudy 12/05/2014 0714   LABSPEC 1.038 12/05/2014 0714   PHURINE 5.0 12/05/2014 0714   GLUCOSEU Negative 12/05/2014 0714   HGBUR Negative 12/05/2014 0714   BILIRUBINUR Negative 12/05/2014 0714   KETONESUR Negative 12/05/2014 0714   PROTEINUR 30 mg/dL 16/09/9603 5409   NITRITE Negative 12/05/2014 0714   LEUKOCYTESUR Negative 12/05/2014 0714     Assessment & Plan:   1. UTI The patient has had 2 urinary tract infections over the course of the last 2 years. I did discuss the patient that it is uncommon for men his age developed urinary tract infections, but his are quite infrequent. 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 I do not think at this time needs a formal workup for a source of urinary tract infections as there infrequent. I have advised him that if he develops 2 urinary tract infections in 6 months or 3 in 1 year that he should follow-up with [place] for more formal evaluation for recurrent UTI. He will otherwise follow up with [place] as needed.   Return if symptoms worsen or fail to improve.  Hildred Laser, MD  [facility] [address] [place], [place] [address] [phone]

## 2016-10-04 ENCOUNTER — Encounter: Payer: Self-pay | Admitting: Emergency Medicine

## 2016-10-04 ENCOUNTER — Emergency Department
Admission: EM | Admit: 2016-10-04 | Discharge: 2016-10-04 | Disposition: A | Discharge status: Home / Self Care | Payer: BLUE CROSS/BLUE SHIELD | Attending: Emergency Medicine | Admitting: Emergency Medicine

## 2016-10-04 DIAGNOSIS — Z87891 Personal history of nicotine dependence: Secondary | ICD-10-CM | POA: Insufficient documentation

## 2016-10-04 DIAGNOSIS — S61210A Laceration without foreign body of right index finger without damage to nail, initial encounter: Secondary | ICD-10-CM | POA: Diagnosis not present

## 2016-10-04 DIAGNOSIS — Y929 Unspecified place or not applicable: Secondary | ICD-10-CM | POA: Insufficient documentation

## 2016-10-04 DIAGNOSIS — Y999 Unspecified external cause status: Secondary | ICD-10-CM | POA: Insufficient documentation

## 2016-10-04 DIAGNOSIS — W260XXA Contact with knife, initial encounter: Secondary | ICD-10-CM | POA: Insufficient documentation

## 2016-10-04 DIAGNOSIS — Y9389 Activity, other specified: Secondary | ICD-10-CM | POA: Insufficient documentation

## 2016-10-04 DIAGNOSIS — I10 Essential (primary) hypertension: Secondary | ICD-10-CM | POA: Diagnosis not present

## 2016-10-04 DIAGNOSIS — Z79899 Other long term (current) drug therapy: Secondary | ICD-10-CM | POA: Insufficient documentation

## 2016-10-04 NOTE — ED Triage Notes (Signed)
Pt presents to ED with c/o laceration to left index finger, pt reports attempted to poke hole to belt with knife and cut his finger. Dressing applied to finger.

## 2016-10-04 NOTE — ED Provider Notes (Signed)
[facility] Emergency Department Provider Note   ____________________________________________   First MD Initiated Contact with Patient 10/04/16 2123     (approximate)  I have reviewed the triage vital signs and the nursing notes.   HISTORY  Chief Complaint Laceration    HPI Jerry Esparza is a 40 y.o. male who presents with laceration to right index finger. Patient was trying to cut another hole into his belt when his knife slipped and cut his finger. Thinks this may have happened around noon today. Patient was able to control bleeding with just pressure. Patient washed out wound with soap and water, peroxide and alcohol. Patient notices that he is unable to bend his finger all the way, because it is painful and he feels like there is swelling. Last tetanus vaccination was last year.    Past Medical History:  Diagnosis Date  . Acid reflux   . Hypertension   . Sleep apnea     Patient Active Problem List   Diagnosis Date Noted  . HTN (hypertension) 09/24/2016  . Obesity 09/24/2016  . Benign essential HTN 02/16/2016  . Body mass index (BMI) of 40.1 to 44.9 in adult (HCC) 02/16/2016  . Unintended weight gain 02/16/2016  . OSA on CPAP 04/17/2015  . Body mass index (BMI) of 40.0-44.9 in adult (HCC) 03/16/2015  . Benign essential hypertension 03/14/2015  . Gastroesophageal reflux disease without esophagitis 03/14/2015  . Erectile dysfunction 03/14/2015    Past Surgical History:  Procedure Laterality Date  . HERNIA REPAIR    . LAPAROSCOPIC GASTRIC SLEEVE RESECTION      Prior to Admission medications   Medication Sig Start Date End Date Taking? Authorizing Provider  lisinopril (PRINIVIL,ZESTRIL) 10 MG tablet Take 10 mg by mouth daily.    Historical Provider, MD    Allergies Review of patient's allergies indicates no known allergies.  Family History  Problem Relation Age of Onset  . Kidney cancer Neg Hx   . Kidney disease Neg Hx   . Prostate  cancer Neg Hx     Social History Social History  Substance Use Topics  . Smoking status: Former Smoker    Types: Cigarettes  . Smokeless tobacco: Never Used     Comment: quit 3 years ago   . Alcohol use Yes     Comment: occasional     Review of Systems Musculoskeletal: Positive for restriction in flexion of right index finger. Skin: Positive for laceration to right index finger.  ____________________________________________   PHYSICAL EXAM:  VITAL SIGNS: ED Triage Vitals [10/04/16 2107]  Enc Vitals Group     BP 123/82     Pulse Rate 82     Resp 18     Temp 98 F (36.7 C)     Temp Source Oral     SpO2 98 %     Weight 256 lb (116.1 kg)     Height 5\' 10"  (1.778 m)     Head Circumference      Peak Flow      Pain Score      Pain Loc      Pain Edu?      Excl. in GC?     Constitutional: Alert and oriented. Well appearing and in no acute distress. Eyes: Conjunctivae are normal.  Head: Atraumatic. Neck: No stridor. Supple, full ROM without pain or difficulty.  Cardiovascular: Right radial and ulnar pulses 2+. Right fingers warm and acyanotic, brisk capillary refill.  Respiratory: Normal respiratory effort.  No  retractions.  Musculoskeletal: Mild restriction in finger flexion at right index PIP, no swelling or deformity noted. Neurologic:  Normal speech and language. No gross focal neurologic deficits are appreciated.  Skin:  Skin is warm and dry. 1.5 cm laceration to right index finger, 2 mm depth, no bleeding, no involvement of tendon or joint.  Psychiatric: Mood and affect are normal. Speech and behavior are normal.  ____________________________________________   LABS (all labs ordered are listed, but only abnormal results are displayed)  Labs Reviewed - No data to  display ____________________________________________  EKG  None. ____________________________________________  RADIOLOGY  None. ____________________________________________   PROCEDURES  Procedure(s) performed: Marland Kitchen.Marland Kitchen.Laceration Repair Date/Time: 10/04/2016 10:01 PM Performed by: Joni ReiningSMITH, Amnah Breuer K Authorized by: Joni ReiningSMITH, [place] Jolin K   Consent:    Consent obtained:  Verbal   Consent given by:  Patient   Risks discussed:  Infection and pain   Alternatives discussed:  No treatment Anesthesia (see MAR for exact dosages):    Anesthesia method:  None Laceration details:    Location:  Finger   Finger location:  R index finger   Length (cm):  1.5   Depth (mm):  2 Repair type:    Repair type:  Simple Exploration:    Wound exploration: wound explored through full range of motion     Wound extent: no foreign bodies/material noted and no tendon damage noted     Contaminated: no   Treatment:    Area cleansed with:  Betadine and saline   Amount of cleaning:  Standard Skin repair:    Repair method:  Tissue adhesive Approximation:    Approximation:  Close   V[place] border: well-aligned   Post-procedure details:    Patient tolerance of procedure:  Tolerated well, no immediate complications    Critical Care performed: No  ____________________________________________   INITIAL IMPRESSION / ASSESSMENT AND PLAN / ED COURSE  Pertinent labs & imaging results that were available during my care of the patient were reviewed by me and considered in my medical decision making (see chart for details).  Patient presents with laceration to right index finger. Laceration was repaired as detailed above. Tetanus is up to date and no need for antibiotics at this time. Follow up with PCP as needed. No other emergency medicine complaints at this time.   Clinical Course     ____________________________________________   FINAL CLINICAL IMPRESSION(S) / ED DIAGNOSES  Final diagnoses:   Laceration of right index finger without foreign body without damage to nail, initial encounter      NEW MEDICATIONS STARTED DURING THIS VISIT:  Discharge Medication List as of 10/04/2016  9:51 PM       Note:  This document was prepared using Dragon voice recognition software and may include unintentional dictation errors.   Joni Reiningonald K Arrie Borrelli, PA-C 10/06/16 0007    Sharman CheekPhillip Stafford, MD 10/07/16 [id number]

## 2017-03-03 IMAGING — US US ABDOMEN LIMITED
1 series · 14 of 25 positions shown · non-contrast
Comparison: None in PACs

CLINICAL DATA: Obstructive sleep apnea syndrome.

EXAM:
US ABDOMEN LIMITED - RIGHT UPPER QUADRANT

[Series 1: us abdomen limited · 0.25mm/px · 14 of 47 slices shown]
[im 1/47]
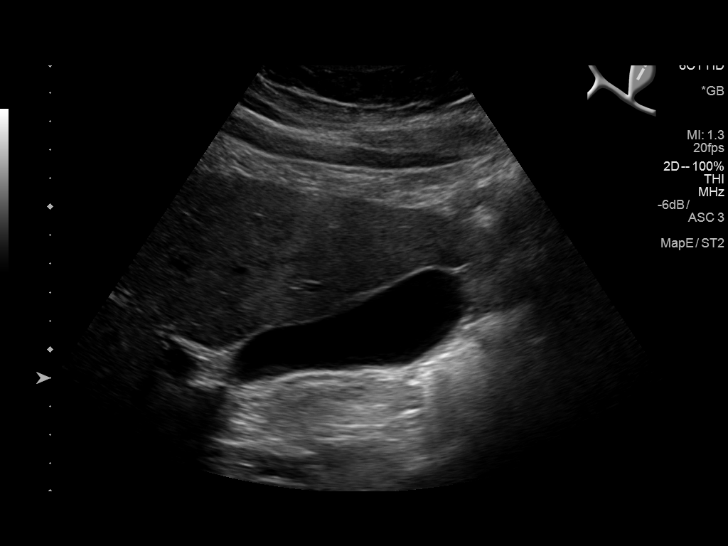
[im 4/47]
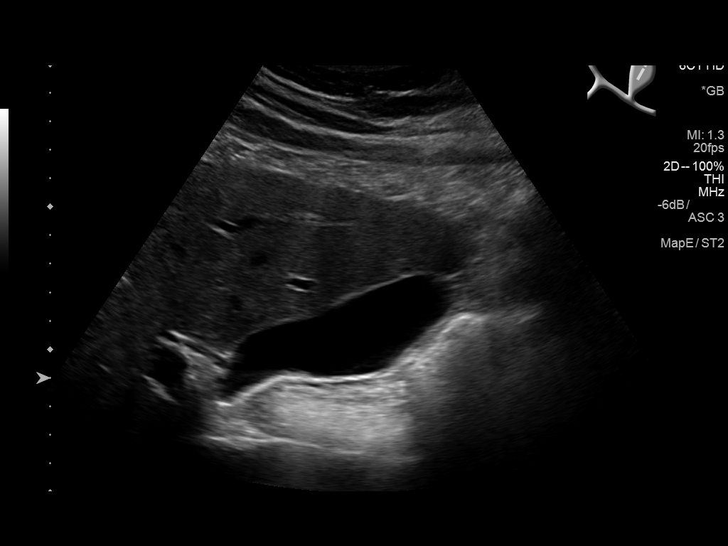
[im 8/47]
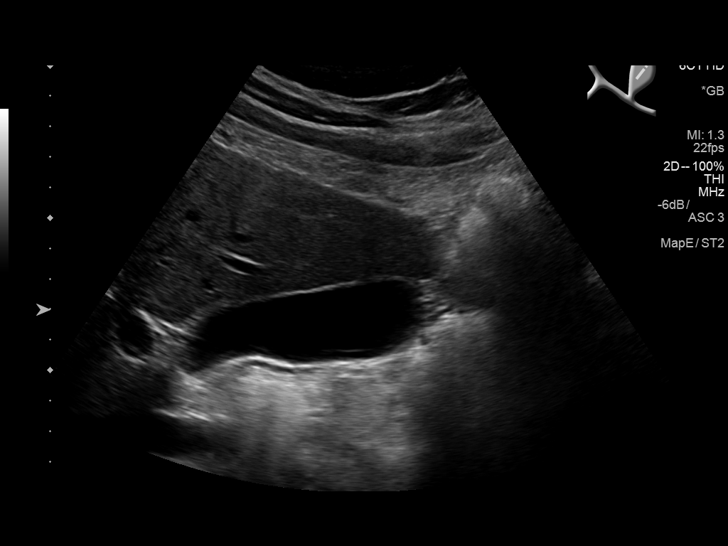
[im 12/47]
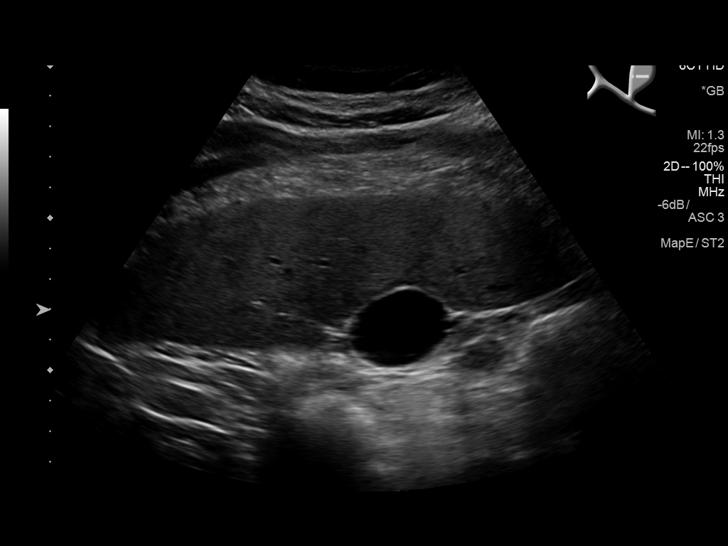
[im 16/47]
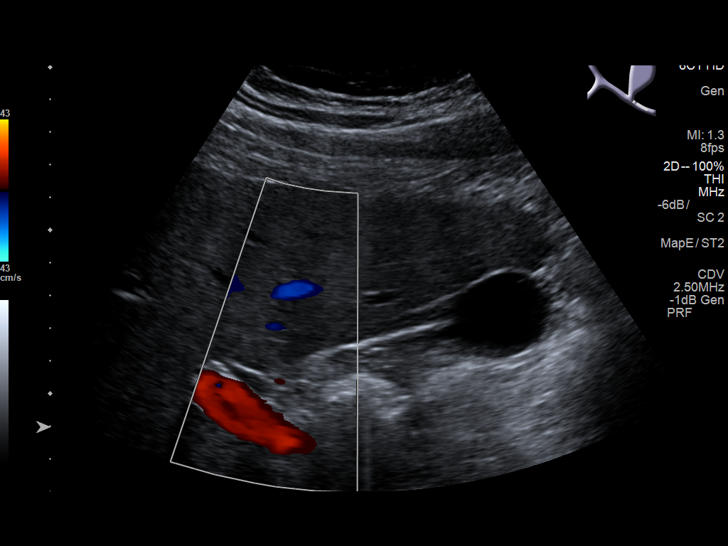
[im 18/47]
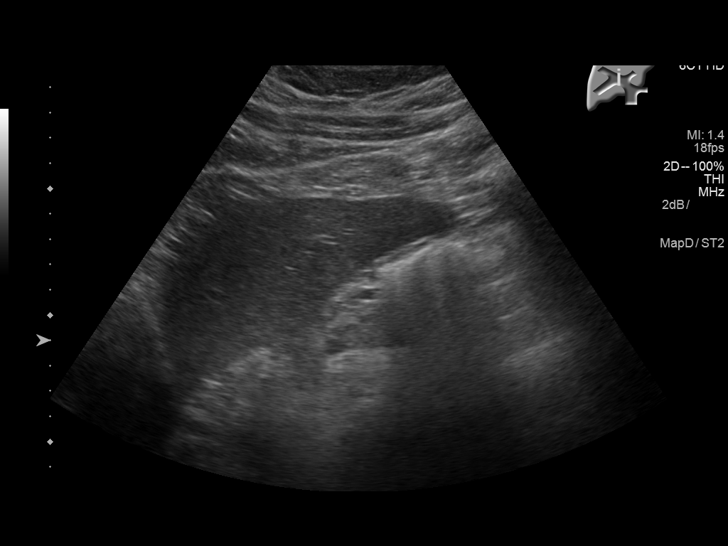
[im 22/47]
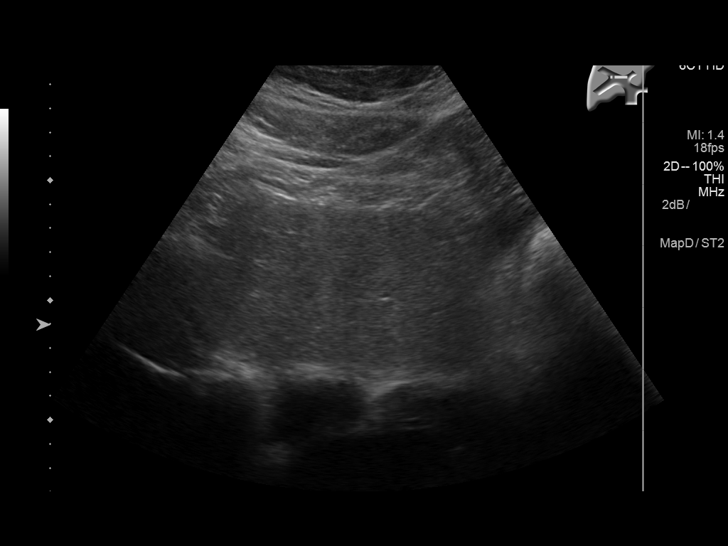
[im 25/47]
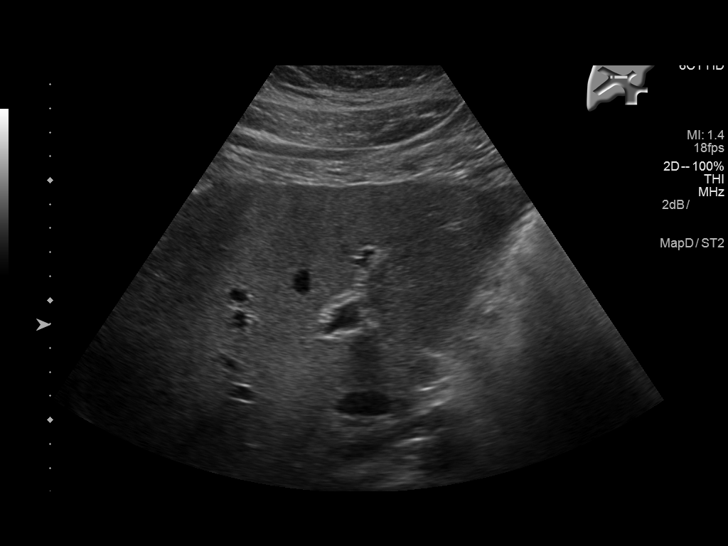
[im 29/47]
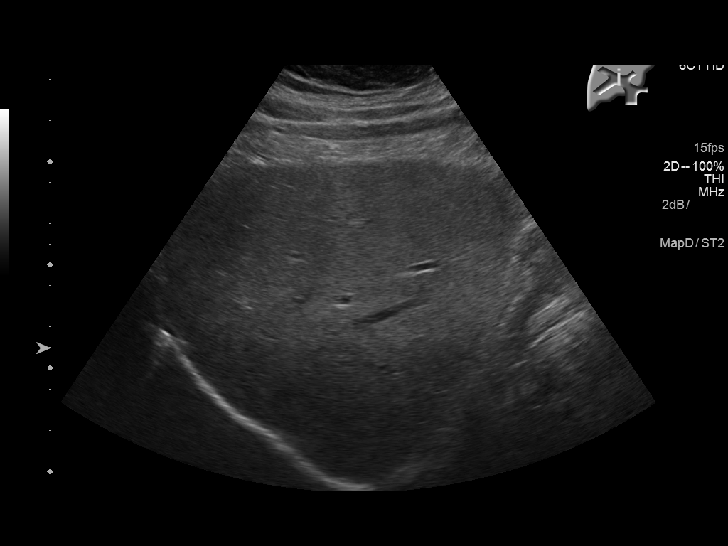
[im 31/47]
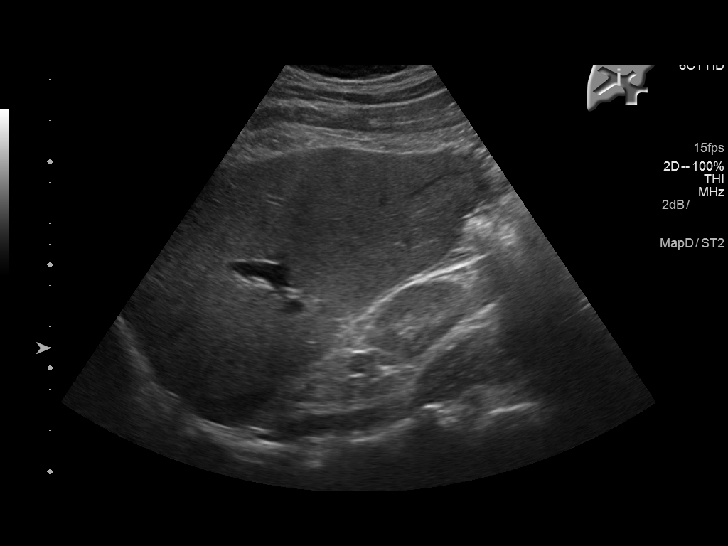
[im 35/47]
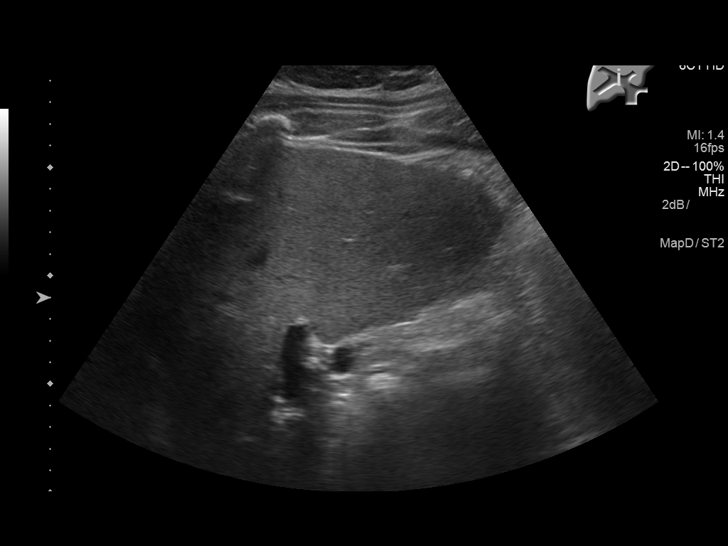
[im 39/47]
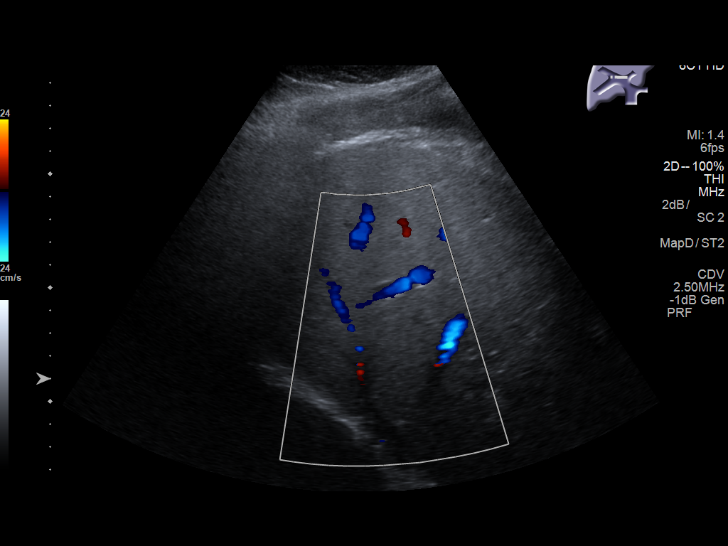
[im 43/47]
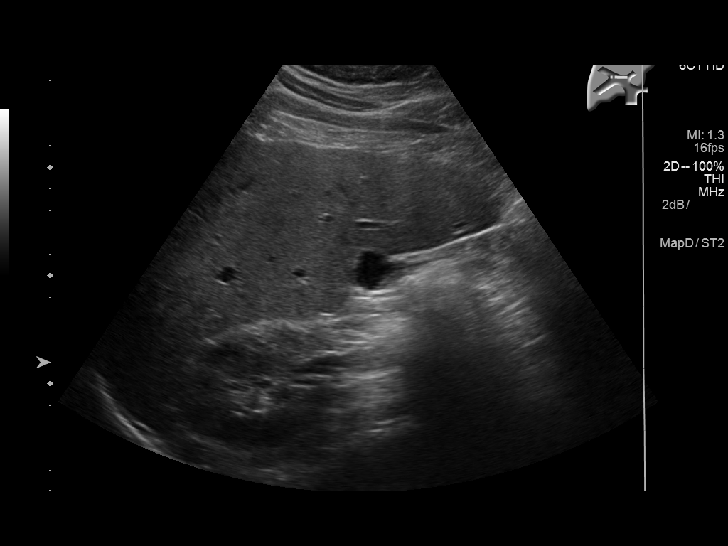
[im 47/47]
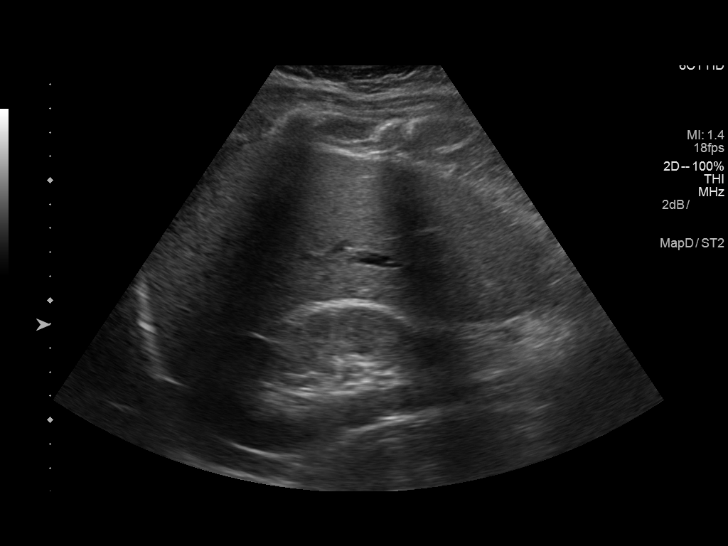

[14 of 25 positions shown; findings below may reference images not displayed]

FINDINGS: Gallbladder:

No gallstones or wall thickening visualized. No sonographic Murphy
sign noted by sonographer.

Common bile duct:

Diameter: 4.3 mm

Liver:

No focal lesion identified. Within normal limits in parenchymal
echogenicity.
IMPRESSION: Normal appearance of the liver, gallbladder, and common bile duct.

## 2018-09-26 DEATH — deceased
